# Patient Record
Sex: Male | Born: 1985 | Race: White | Hispanic: No | Marital: Married | Smoking: Former smoker
Health system: Southern US, Community
[De-identification: ages and names within clinical notes are randomized; demographics above are authoritative.]

---
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
## 2007-04-20 ENCOUNTER — Ambulatory Visit (HOSPITAL_COMMUNITY): Admission: RE | Admit: 2007-04-20 | Discharge: 2007-04-20 | Payer: Self-pay | Admitting: Family Medicine

## 2008-07-21 IMAGING — CT CT HEAD W/O CM
1 series · 16 of 30 positions shown, 20 images · non-contrast
Comparison: None

CLINICAL DATA: FRONTAL HEADACHE X4 DAYS;

CT HEAD WITHOUT CONTRAST
TECHNIQUE: Contiguous axial images were obtained from the base of
the skull through the vertex without contrast.

[Series 2: headseq 4.8 h37s · axial · 0.43mm/px · z∈[+1030,+1182]mm · 16 of 36 slices shown, 20 images]
[im 2/36  brain]
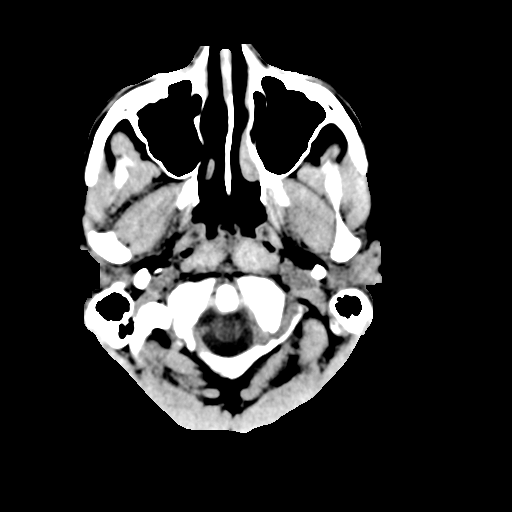
[im 2/36  bone]
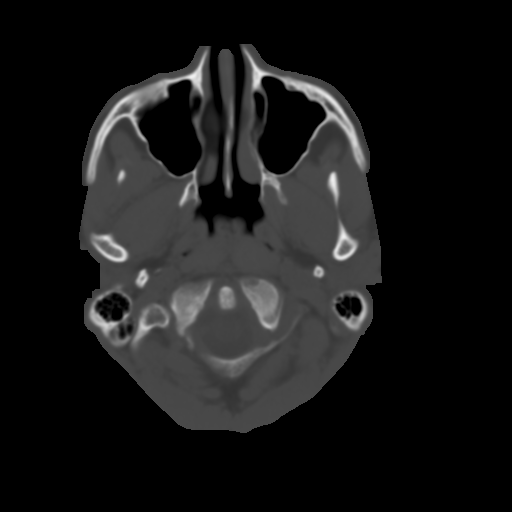
[im 4/36  brain]
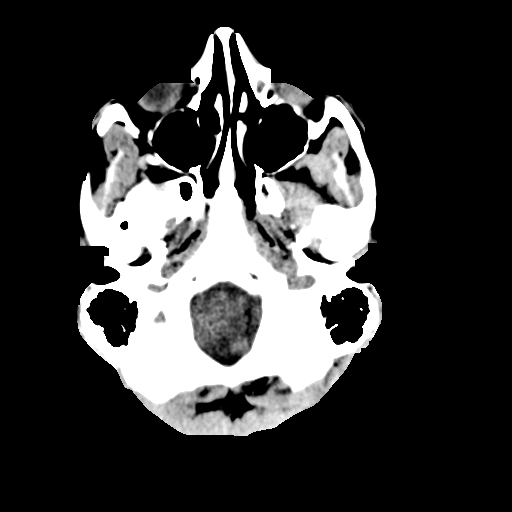
[im 7/36  brain]
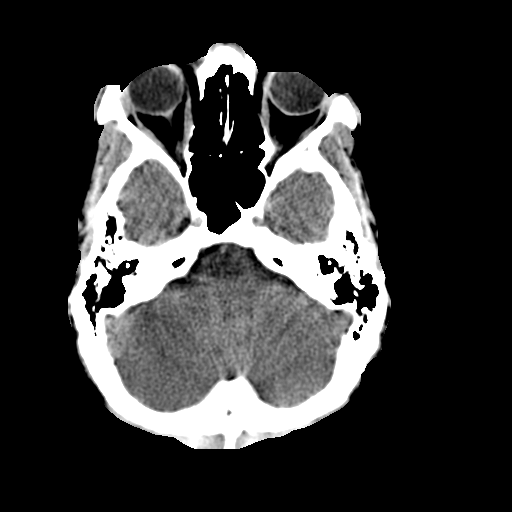
[im 9/36  brain]
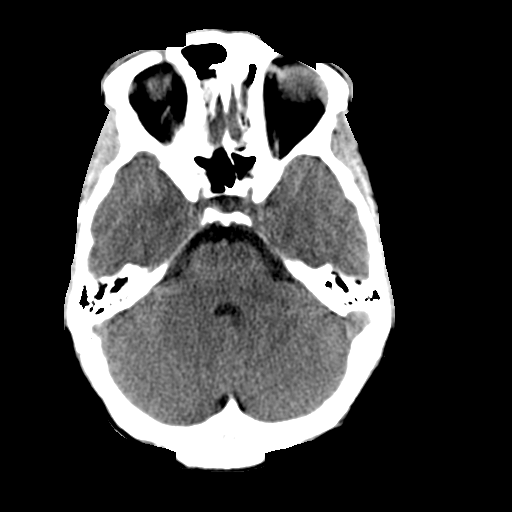
[im 10/36  brain]
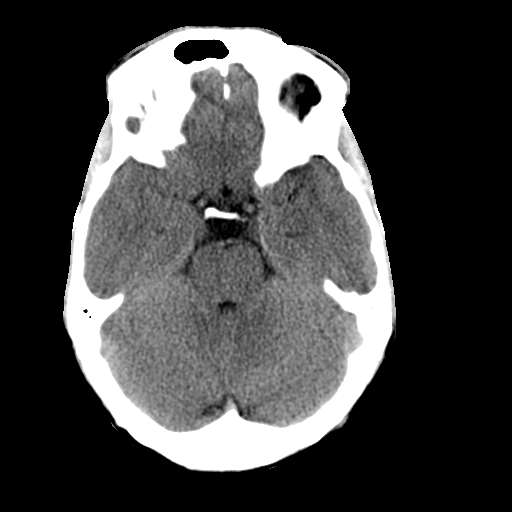
[im 10/36  bone]
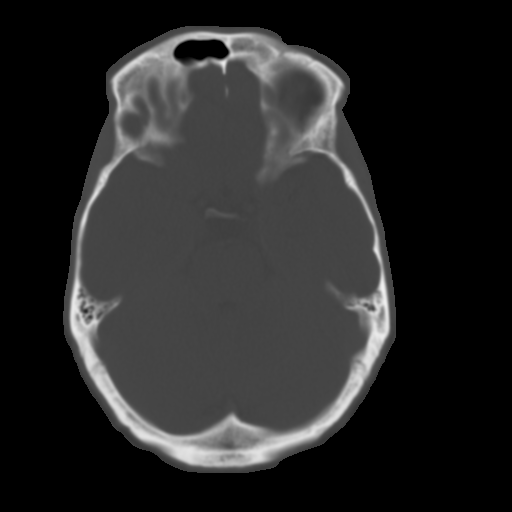
[im 13/36  brain]
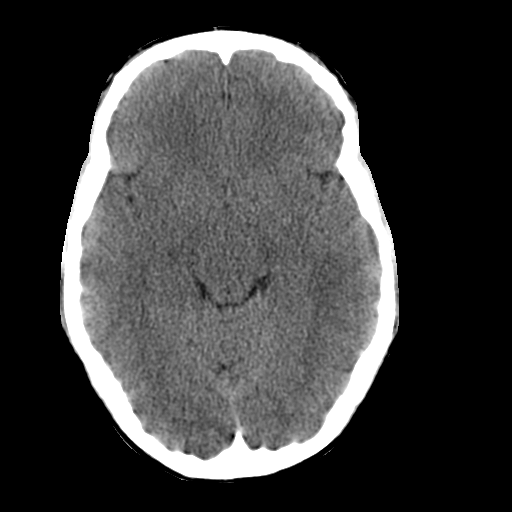
[im 15/36  brain]
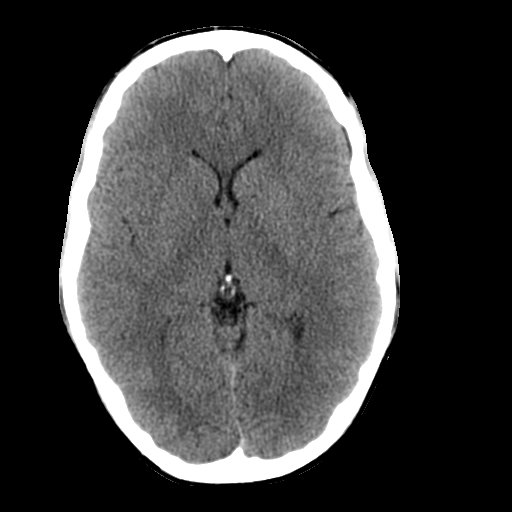
[im 17/36  brain]
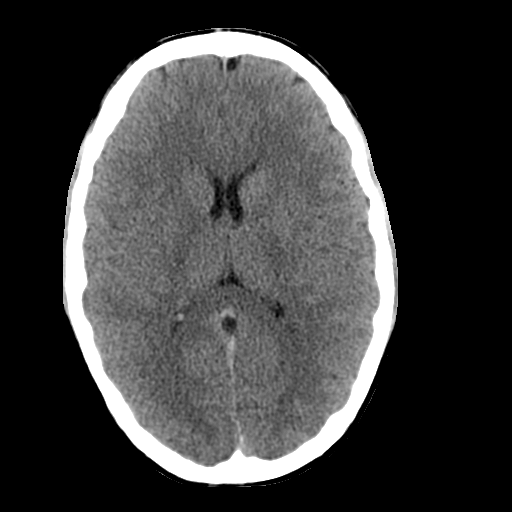
[im 19/36  brain]
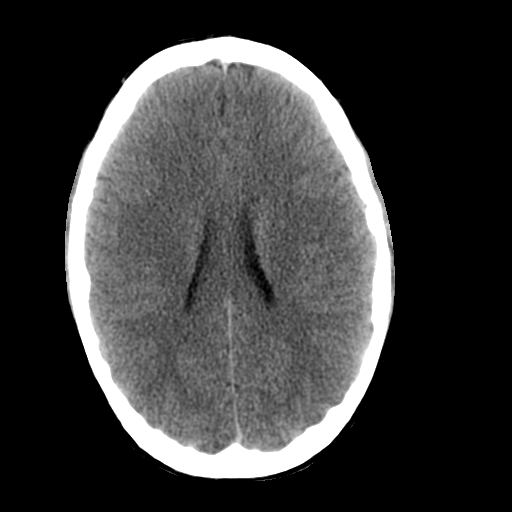
[im 19/36  bone]
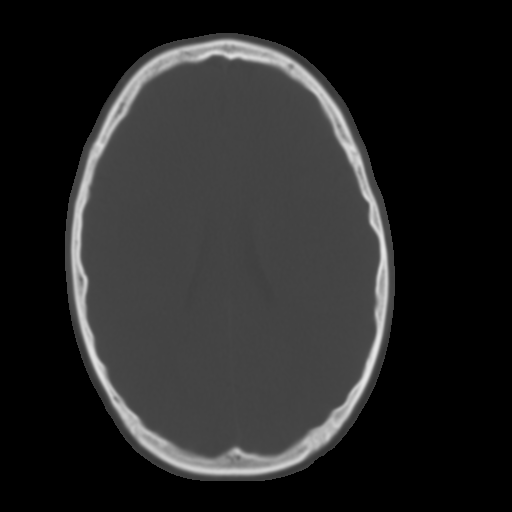
[im 21/36  brain]
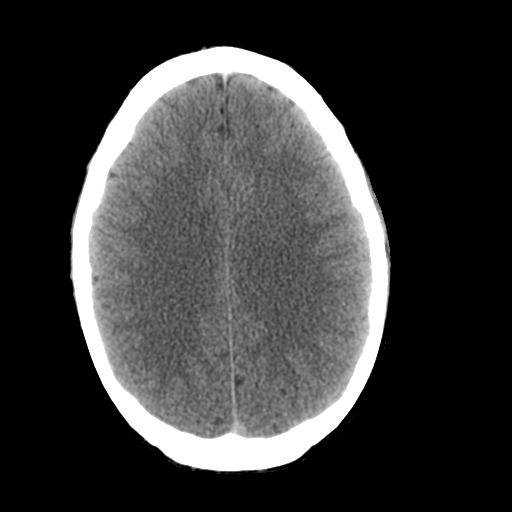
[im 23/36  brain]
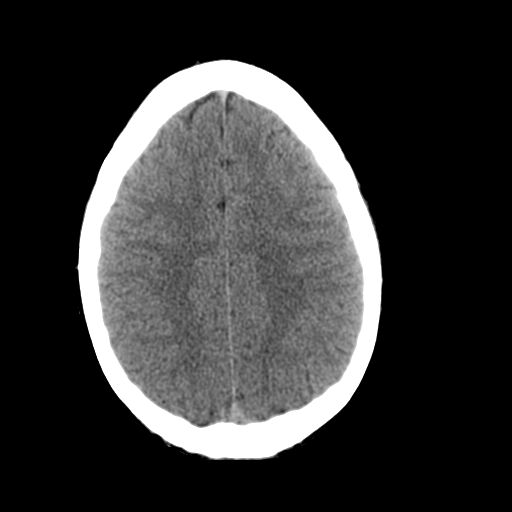
[im 26/36  brain]
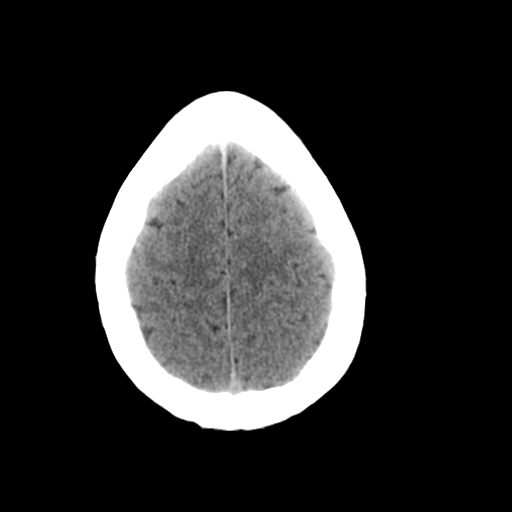
[im 27/36  brain]
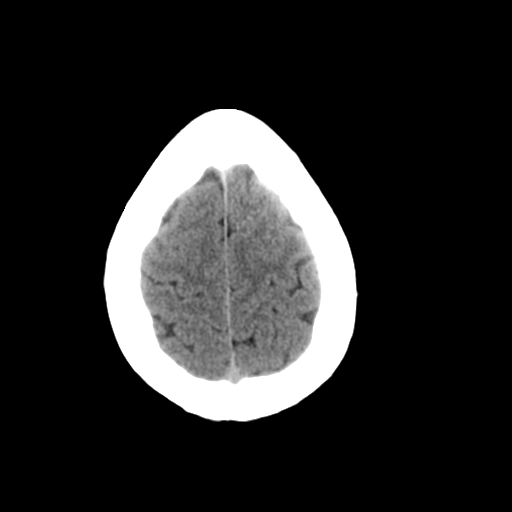
[im 27/36  bone]
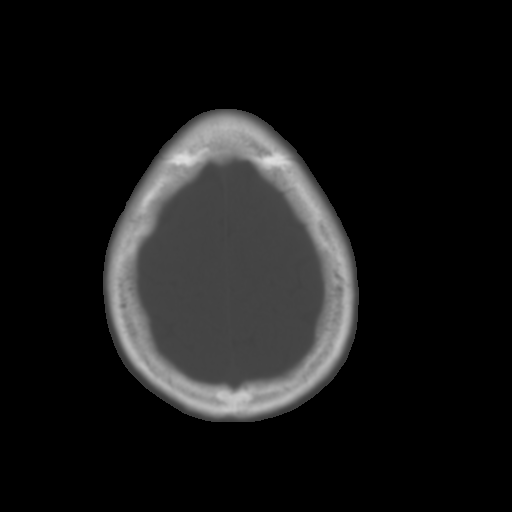
[im 29/36  brain]
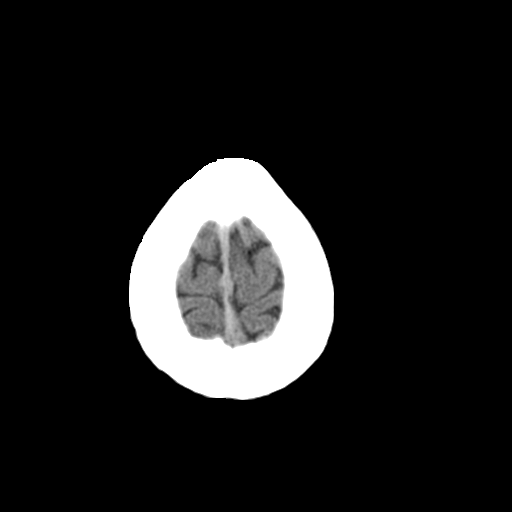
[im 32/36  brain]
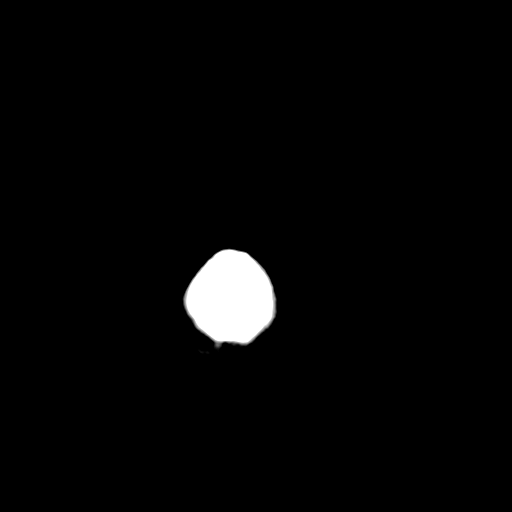
[im 34/36  brain]
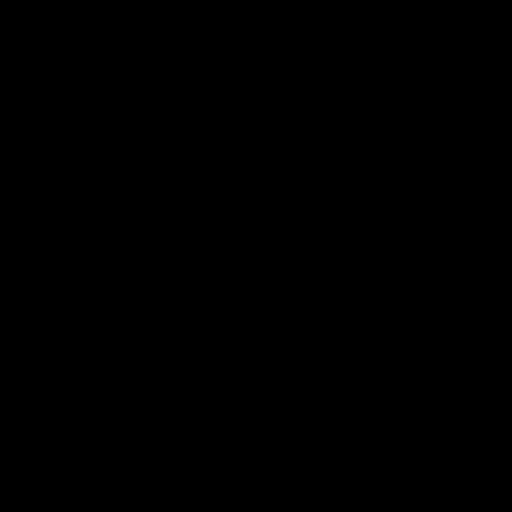

[16 of 30 positions shown; findings below may reference images not displayed]

FINDINGS: Minimal beam hardening from calvarium.
Normal ventricular morphology.
No midline shift or mass effect.
Normal appearance of brain parenchyma.
No intracranial hemorrhage, mass lesion, or acute infarct.
Visualized paranasal sinuses and mastoid air cells clear.
Bones unremarkable.
IMPRESSION: No acute intracranial abnormalities.

## 2010-05-01 ENCOUNTER — Emergency Department (HOSPITAL_COMMUNITY)
Admission: EM | Admit: 2010-05-01 | Discharge: 2010-05-01 | Disposition: A | Discharge status: Home / Self Care | Attending: Emergency Medicine | Admitting: Emergency Medicine

## 2010-05-01 DIAGNOSIS — R21 Rash and other nonspecific skin eruption: Secondary | ICD-10-CM | POA: Insufficient documentation

## 2012-11-29 ENCOUNTER — Emergency Department (HOSPITAL_COMMUNITY)
Admission: EM | Admit: 2012-11-29 | Discharge: 2012-11-29 | Disposition: A | Discharge status: Home / Self Care | Payer: BC Managed Care – PPO | Attending: Emergency Medicine | Admitting: Emergency Medicine

## 2012-11-29 ENCOUNTER — Encounter (HOSPITAL_COMMUNITY): Payer: Self-pay | Admitting: Emergency Medicine

## 2012-11-29 ENCOUNTER — Emergency Department (HOSPITAL_COMMUNITY): Payer: BC Managed Care – PPO

## 2012-11-29 DIAGNOSIS — Y939 Activity, unspecified: Secondary | ICD-10-CM | POA: Insufficient documentation

## 2012-11-29 DIAGNOSIS — Y929 Unspecified place or not applicable: Secondary | ICD-10-CM | POA: Insufficient documentation

## 2012-11-29 DIAGNOSIS — S62309A Unspecified fracture of unspecified metacarpal bone, initial encounter for closed fracture: Secondary | ICD-10-CM | POA: Insufficient documentation

## 2012-11-29 DIAGNOSIS — Z87891 Personal history of nicotine dependence: Secondary | ICD-10-CM | POA: Insufficient documentation

## 2012-11-29 DIAGNOSIS — W2209XA Striking against other stationary object, initial encounter: Secondary | ICD-10-CM | POA: Insufficient documentation

## 2012-11-29 DIAGNOSIS — S62339A Displaced fracture of neck of unspecified metacarpal bone, initial encounter for closed fracture: Secondary | ICD-10-CM

## 2012-11-29 MED ORDER — HYDROCODONE-ACETAMINOPHEN 5-325 MG PO TABS: 1.0000 | ORAL_TABLET | ORAL | Status: AC | PRN

## 2012-11-29 MED ORDER — HYDROCODONE-ACETAMINOPHEN 5-325 MG PO TABS
1.0000 | ORAL_TABLET | Freq: Once | ORAL | Status: AC
  Administered 2012-11-29: 1 via ORAL
  Filled 2012-11-29: qty 1

## 2012-11-29 NOTE — ED Notes (Signed)
Injured my right hand yesterday afternoon, lost my temper and hit something per pt. Possible fracture per pt.

## 2012-11-29 NOTE — ED Provider Notes (Signed)
CSN: [id number]     Arrival date & time 11/29/12  2019 History   First MD Initiated Contact with Patient 11/29/12 2035     Chief Complaint  Patient presents with  . Hand Injury   (Consider location/radiation/quality/duration/timing/severity/associated sxs/prior Treatment) HPI Comments: TANAV ORSAK is a 27 y.o. Male presenting with right hand pain localized to his 5th finger since yesterday afternoon when he lost his temper and struck a wall.  He has pain and swelling which is worsened with palpation and range of motion.  He has applied an ice pack and has taken ibuprofen without improvement in pain.  He denies numbness in the finger or hand.  He has problems trying to keep the 5th finger aligned with the others.       The history is provided by the patient.    History reviewed. No pertinent past medical history. History reviewed. No pertinent past surgical history. History reviewed. No pertinent family history. History  Substance Use Topics  . Smoking status: Former Games developer  . Smokeless tobacco: Current User  . Alcohol Use: Yes     Comment: social    Review of Systems  Constitutional: Negative for fever and chills.  Musculoskeletal: Positive for arthralgias and joint swelling. Negative for myalgias.  Neurological: Negative for weakness and numbness.    Allergies  Review of patient's allergies indicates no known allergies.  Home Medications   Current Outpatient Rx  Name  Route  Sig  Dispense  Refill  . ibuprofen (ADVIL,MOTRIN) 200 MG tablet   Oral   Take 600 mg by mouth once as needed for pain.         Marland Kitchen HYDROcodone-acetaminophen (NORCO/VICODIN) 5-325 MG per tablet   Oral   Take 1 tablet by mouth every 4 (four) hours as needed for pain.   15 tablet   0    BP 161/112  Pulse 81  Temp(Src) 98.2 F (36.8 C) (Oral)  Resp 20  Ht 5\' 6"  (1.676 m)  Wt 200 lb (90.719 kg)  BMI 32.30 kg/m2  SpO2 100% Physical Exam  Constitutional: He appears well-developed and  well-nourished.  HENT:  Head: Atraumatic.  Neck: Normal range of motion.  Cardiovascular:  Pulses equal bilaterally  Musculoskeletal: He exhibits edema and tenderness.       Right hand: He exhibits bony tenderness and swelling. He exhibits normal two-point discrimination, normal capillary refill and no deformity. Normal sensation noted.       Hands: Neurological: He is alert. He has normal strength. He displays normal reflexes. No sensory deficit.  Equal strength  Skin: Skin is warm and dry.  Psychiatric: He has a normal mood and affect.    ED Course  Procedures (including critical care time) Labs Review Labs Reviewed - No data to display Imaging Review Dg Hand Complete Right  11/29/2012   CLINICAL DATA:  Hand injury.  EXAM: RIGHT HAND - COMPLETE 3+ VIEW  COMPARISON:  Radiographs 03/07/2006  FINDINGS: There is a minimally displaced boxer's fracture without involvement of the articular surface of the 5th metacarpal head. No other acute fractures are identified. There is no dislocation. Mild soft tissue swelling is noted in the ulnar aspect of the hand.  IMPRESSION: Minimally displaced boxer's fracture.   Electronically Signed   By: Roxy Horseman M.D.   On: 11/29/2012 20:50    EKG Interpretation   None       MDM   1. Boxer's fracture, closed, initial encounter    Patients labs and/or radiological  studies were viewed and considered during the medical decision making and disposition process. Pt was placed in ulner gutter splint.  He was given sling.  Prescribed hydrocodone.  Pt to f/u with ortho - will call in am for appt.   Splint was examined post application, pain improved,  Patient can wiggle digits, less than 3 sec cap refill.      Burgess Amor, PA-C 11/29/12 2229

## 2012-11-30 ENCOUNTER — Encounter: Payer: Self-pay | Admitting: Orthopedic Surgery

## 2012-11-30 ENCOUNTER — Ambulatory Visit (INDEPENDENT_AMBULATORY_CARE_PROVIDER_SITE_OTHER): Payer: BC Managed Care – PPO | Admitting: Orthopedic Surgery

## 2012-11-30 VITALS — BP 140/96 | Ht 66.0 in | Wt 195.0 lb

## 2012-11-30 DIAGNOSIS — S62339A Displaced fracture of neck of unspecified metacarpal bone, initial encounter for closed fracture: Secondary | ICD-10-CM | POA: Insufficient documentation

## 2012-11-30 DIAGNOSIS — S62309A Unspecified fracture of unspecified metacarpal bone, initial encounter for closed fracture: Secondary | ICD-10-CM

## 2012-11-30 NOTE — Patient Instructions (Addendum)
Keep fingers taped x 4 weeks   Take medication as needed   OOW X 4 WEEKS

## 2012-11-30 NOTE — Progress Notes (Signed)
Patient ID: Erik Hammond, male   DOB: Nov 24, 1985, 27 y.o.   MRN: [id number]  Chief Complaint  Patient presents with  . Hand Pain    Right boxers fracture d/t injury 11/28/12    HISTORY:  27 year old malee presents with a fractured right fifth metacarpal secondary to hitting  Date of injury 11/28/2012  Initial ER visit November 2. 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 Ambulating normally  The right hand is swollen there are 2 abrasions over the knuckles of the small and ring finger he has decreased range of motion tenderness at the fracture site no malalignment issues joint is stable muscle tone is normal no atrophy in the hand skin is otherwise normal pulse is good capillary refill is good sensation is normal  X-ray shows a nondisplaced hairline fracture of the distal aspect of the fifth metacarpal  Encounter Diagnosis  Name Primary?  Marland Kitchen Boxer's fracture, closed, initial encounter Yes    Recommend buddy taping active range of motion to work 2 weeks x-ray in 4 weeks  Office visit only

## 2012-11-30 NOTE — ED Provider Notes (Signed)
Medical screening examination/treatment/procedure(s) were performed by non-physician practitioner and as supervising physician I was immediately available for consultation/collaboration.  EKG Interpretation   None         Laray Anger, DO 11/30/12 1610

## 2012-12-14 ENCOUNTER — Telehealth: Payer: Self-pay | Admitting: Orthopedic Surgery

## 2012-12-14 ENCOUNTER — Encounter: Payer: Self-pay | Admitting: Orthopedic Surgery

## 2012-12-14 NOTE — Telephone Encounter (Signed)
Note is ready.  Patient aware - coming  today, 12/14/12 to pick up.

## 2012-12-14 NOTE — Telephone Encounter (Signed)
Give him the note

## 2012-12-14 NOTE — Telephone Encounter (Signed)
Patient called to request an updated work note (for 2 weeks from today, which is the date of his next appointment) as originally discussed at initial visit.  Wishes to pick up note today - all ready to view and sign.  Patient's ph# [phone]

## 2012-12-29 ENCOUNTER — Ambulatory Visit (INDEPENDENT_AMBULATORY_CARE_PROVIDER_SITE_OTHER): Payer: BC Managed Care – PPO | Admitting: Orthopedic Surgery

## 2012-12-29 ENCOUNTER — Encounter: Payer: Self-pay | Admitting: Orthopedic Surgery

## 2012-12-29 ENCOUNTER — Ambulatory Visit (INDEPENDENT_AMBULATORY_CARE_PROVIDER_SITE_OTHER): Payer: BC Managed Care – PPO

## 2012-12-29 VITALS — BP 136/100 | Ht 66.0 in | Wt 195.0 lb

## 2012-12-29 DIAGNOSIS — S62339A Displaced fracture of neck of unspecified metacarpal bone, initial encounter for closed fracture: Secondary | ICD-10-CM

## 2012-12-29 DIAGNOSIS — S62309A Unspecified fracture of unspecified metacarpal bone, initial encounter for closed fracture: Secondary | ICD-10-CM

## 2012-12-29 NOTE — Progress Notes (Signed)
Patient ID: Erik Hammond, male   DOB: [birth year], 27 y.o.   MRN: [id number]  Chief Complaint  Patient presents with  . Follow-up    4 week follow up right boxers fracture DOI 11/28/12    BP 136/100  Ht 5\' 6"  (1.676 m)  Wt 195 lb (88.451 kg)  BMI 31.49 kg/m2  Encounter Diagnosis  Name Primary?  . Closed boxer's fracture Yes   Right hand injury:  Ros: normal   General appearance is normal, the patient is alert and oriented x3 with normal mood and affect. Full range of motion  No deforemity  No swelling and no tenderness    Repeat x-rays right fifth metacarpal boxer's fracture  X-rays show fracture healing  Patient will be released to normal activities

## 2012-12-29 NOTE — Patient Instructions (Addendum)
activities as tolerated  Fit for full duty

## 2014-03-02 IMAGING — CR DG HAND COMPLETE 3+V*R*
3 series · 3 of 3 positions shown · non-contrast
Comparison: Radiographs 03/07/2006

CLINICAL DATA: Hand injury.

EXAM:
RIGHT HAND - COMPLETE 3+ VIEW

[view not recorded (1 of 3)]
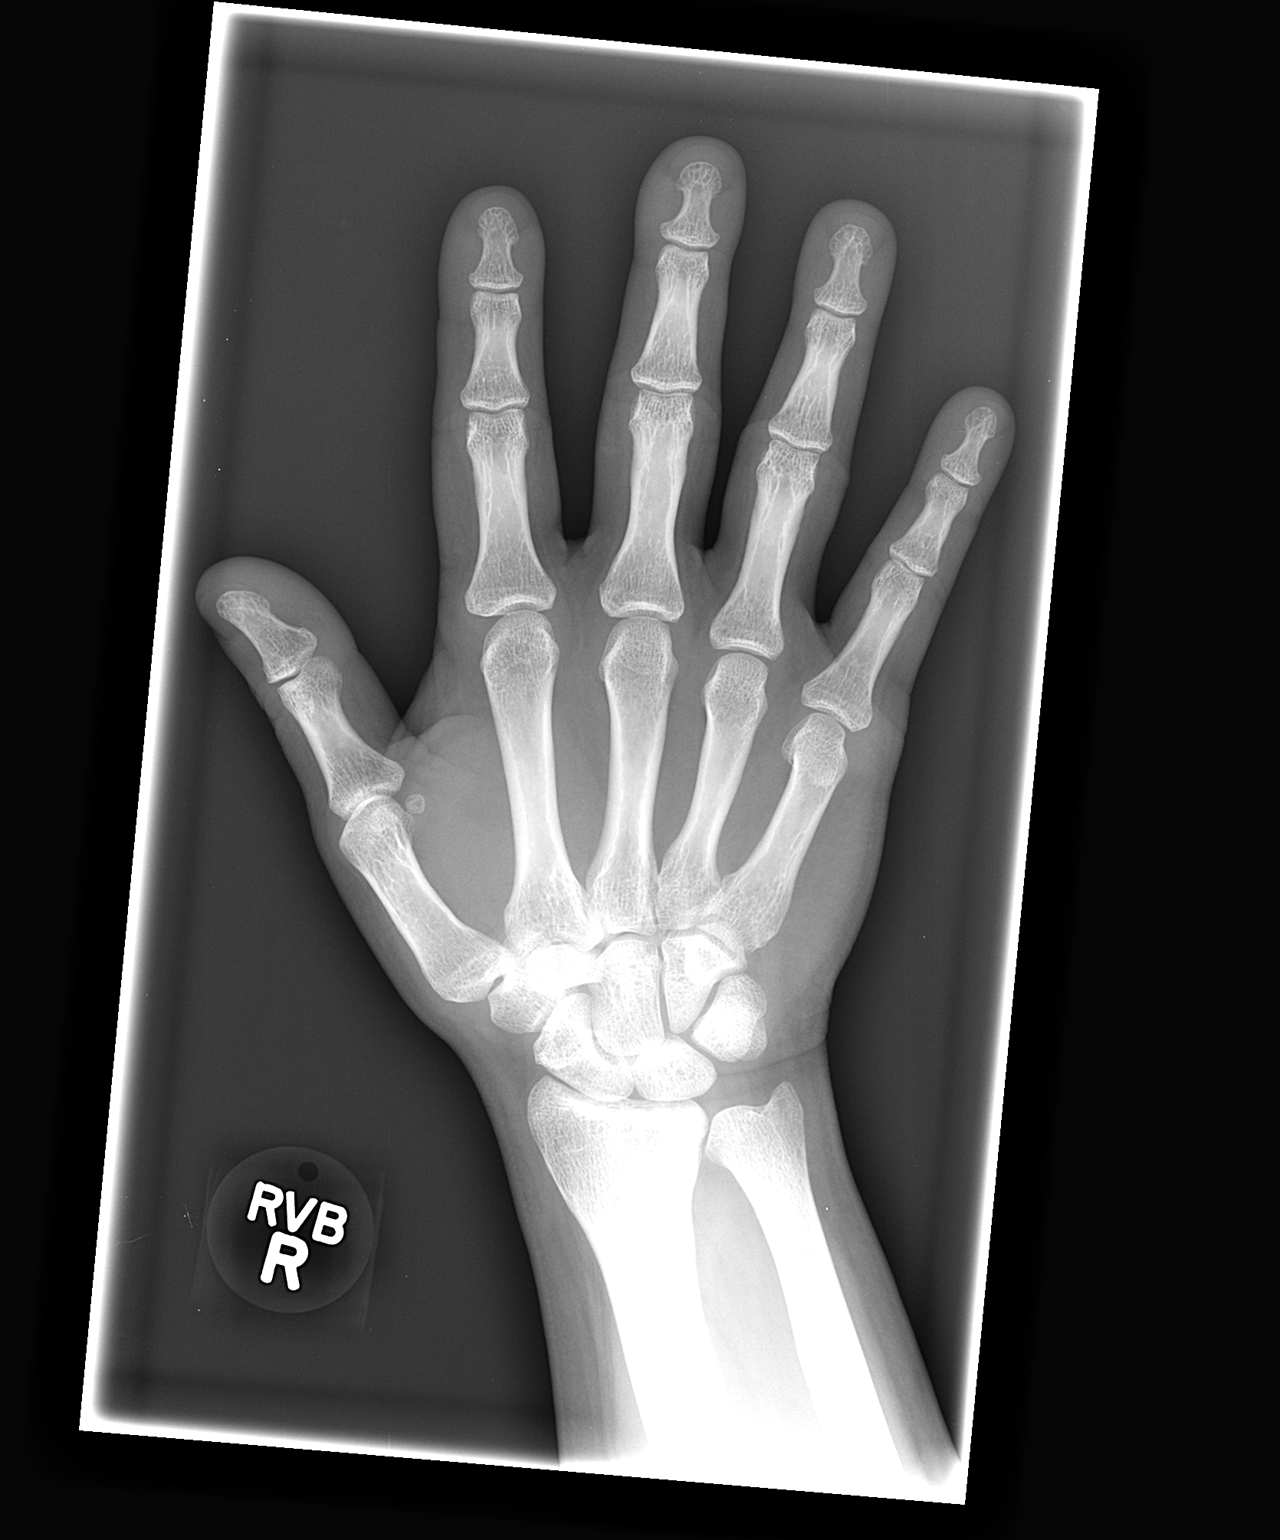

[view not recorded (2 of 3)]
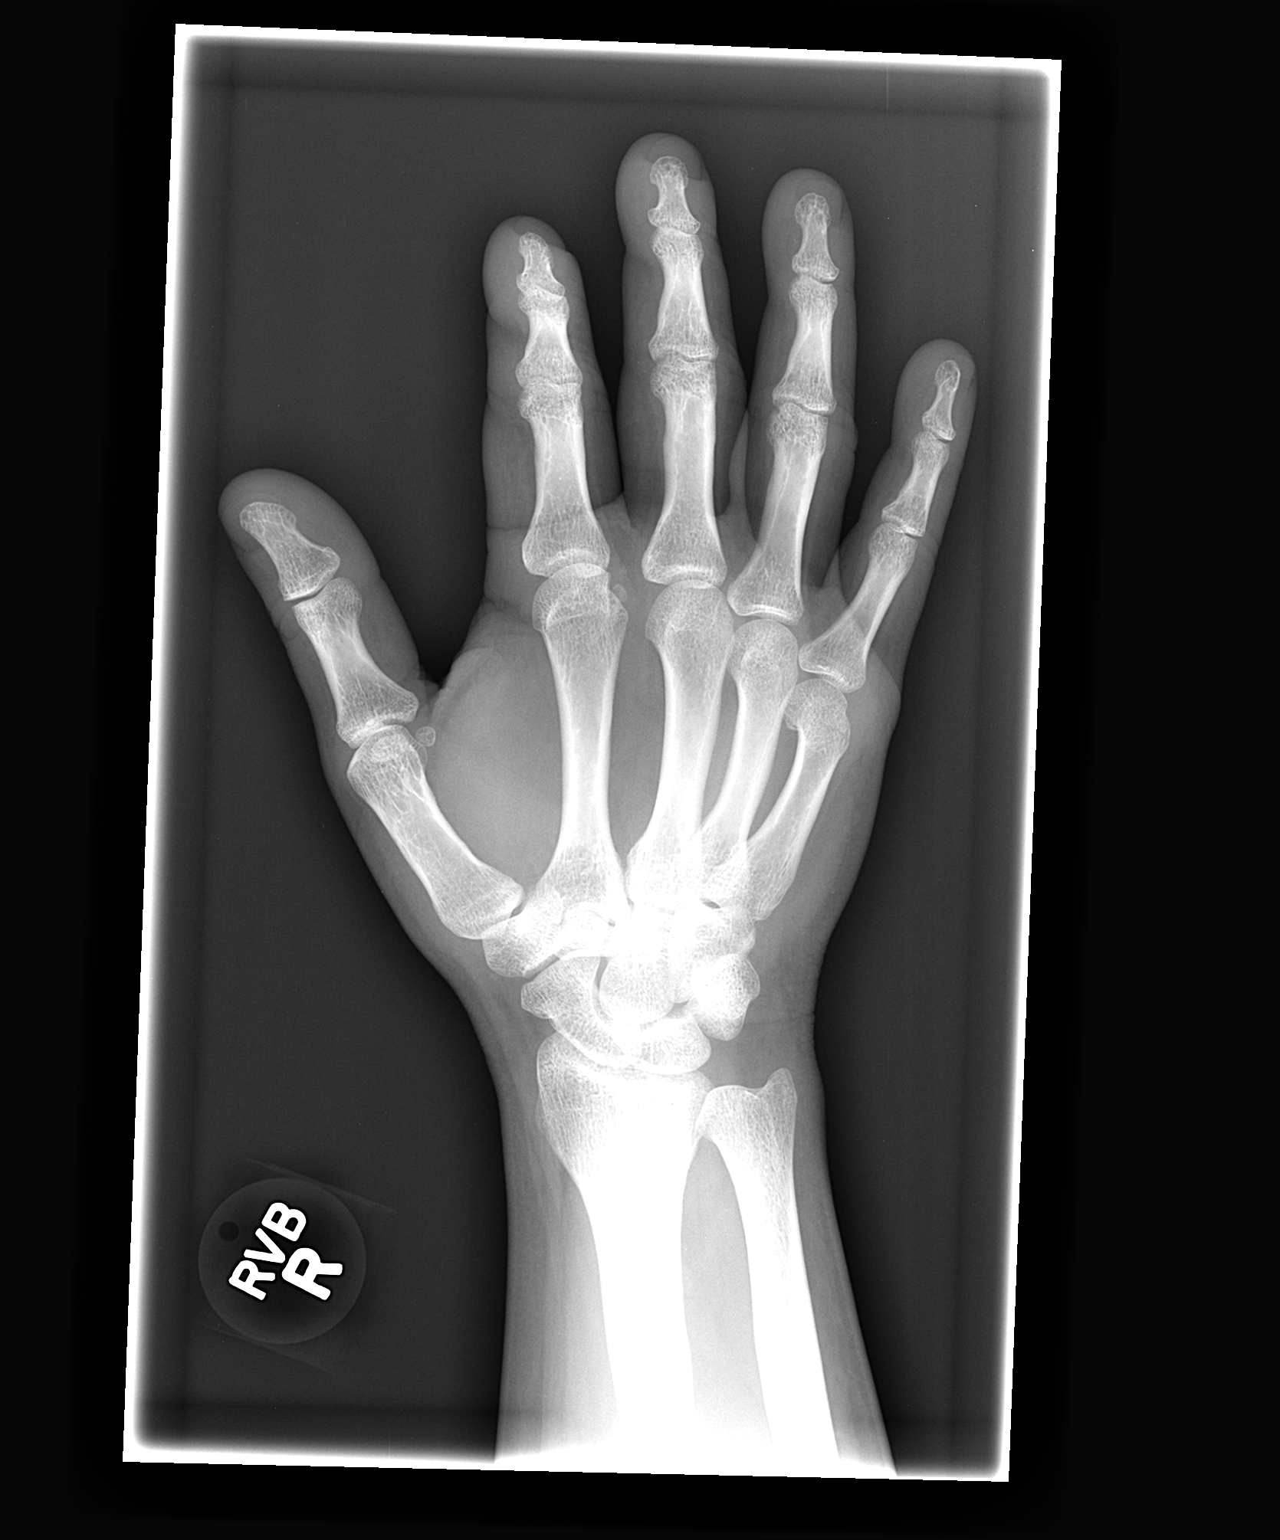

[view not recorded (3 of 3)]
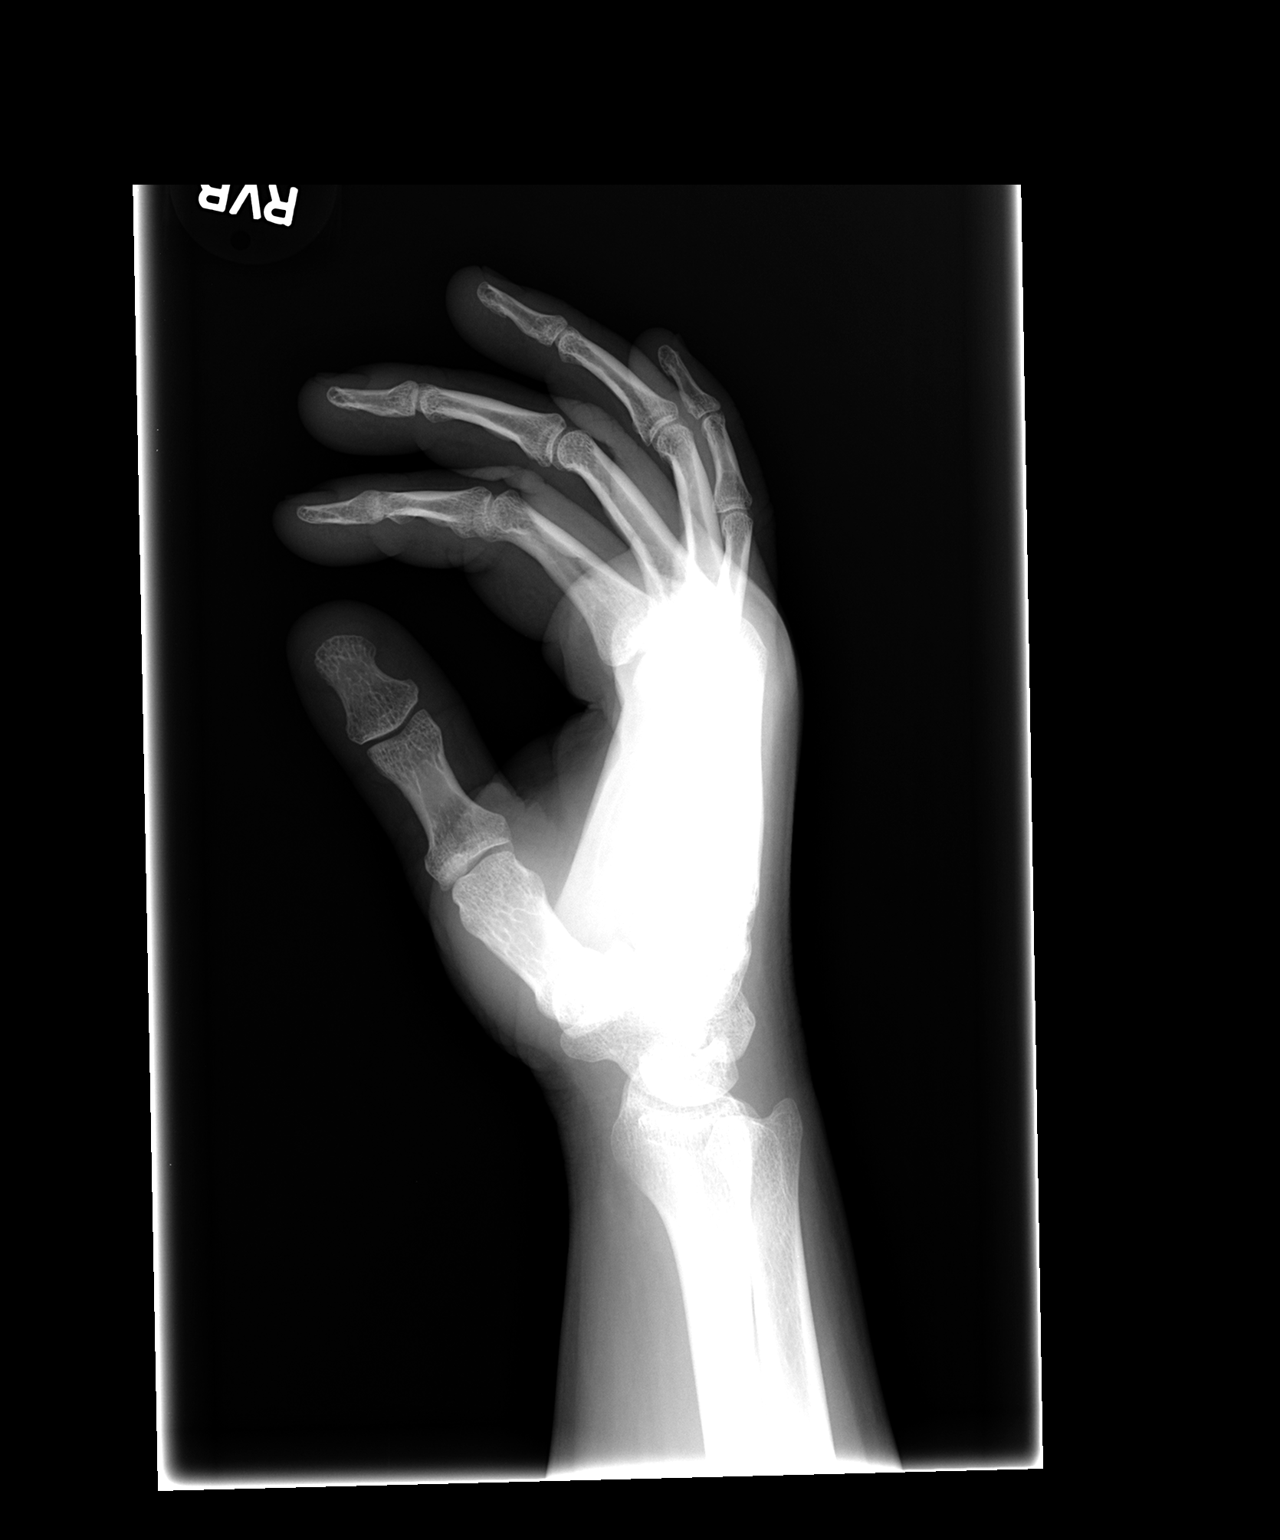

[3 of 3 positions shown; findings below may reference images not displayed]

FINDINGS: There is a minimally displaced boxer's fracture without involvement
of the articular surface of the 5th metacarpal head. No other acute
fractures are identified. There is no dislocation. Mild soft tissue
swelling is noted in the ulnar aspect of the hand.
IMPRESSION: Minimally displaced boxer's fracture.

## 2014-08-13 ENCOUNTER — Encounter (HOSPITAL_COMMUNITY): Payer: Self-pay | Admitting: Emergency Medicine

## 2014-08-13 ENCOUNTER — Emergency Department (HOSPITAL_COMMUNITY)
Admission: EM | Admit: 2014-08-13 | Discharge: 2014-08-13 | Disposition: A | Discharge status: Home / Self Care | Payer: BLUE CROSS/BLUE SHIELD | Attending: Emergency Medicine | Admitting: Emergency Medicine

## 2014-08-13 DIAGNOSIS — Z87891 Personal history of nicotine dependence: Secondary | ICD-10-CM | POA: Diagnosis not present

## 2014-08-13 DIAGNOSIS — S61412A Laceration without foreign body of left hand, initial encounter: Secondary | ICD-10-CM | POA: Diagnosis not present

## 2014-08-13 DIAGNOSIS — Y998 Other external cause status: Secondary | ICD-10-CM | POA: Diagnosis not present

## 2014-08-13 DIAGNOSIS — Y9289 Other specified places as the place of occurrence of the external cause: Secondary | ICD-10-CM | POA: Diagnosis not present

## 2014-08-13 DIAGNOSIS — Y288XXA Contact with other sharp object, undetermined intent, initial encounter: Secondary | ICD-10-CM | POA: Insufficient documentation

## 2014-08-13 DIAGNOSIS — Y9389 Activity, other specified: Secondary | ICD-10-CM | POA: Insufficient documentation

## 2014-08-13 MED ORDER — LIDOCAINE HCL (PF) 1 % IJ SOLN
INTRAMUSCULAR | Status: AC
  Filled 2014-08-13: qty 5

## 2014-08-13 NOTE — Discharge Instructions (Signed)
Sutured Wound Care °Sutures are stitches that can be used to close wounds. Wound care helps prevent pain and infection.  °HOME CARE INSTRUCTIONS  °· Rest and elevate the injured area until all the pain and swelling are gone. °· Only take over-the-counter or prescription medicines for pain, discomfort, or fever as directed by your caregiver. °· After 48 hours, gently wash the area with mild soap and water once a day, or as directed. Rinse off the soap. Pat the area dry with a clean towel. Do not rub the wound. This may cause bleeding. °· Follow your caregiver's instructions for how often to change the bandage (dressing). Stop using a dressing after 2 days or after the wound stops draining. °· If the dressing sticks, moisten it with soapy water and gently remove it. °· Apply ointment on the wound as directed. °· Avoid stretching a sutured wound. °· Drink enough fluids to keep your urine clear or pale yellow. °· Follow up with your caregiver for suture removal as directed. °· Use sunscreen on your wound for the next 3 to 6 months so the scar will not darken. °SEEK IMMEDIATE MEDICAL CARE IF:  °· Your wound becomes red, swollen, hot, or tender. °· You have increasing pain in the wound. °· You have a red streak that extends from the wound. °· There is pus coming from the wound. °· You have a fever. °· You have shaking chills. °· There is a bad smell coming from the wound. °· You have persistent bleeding from the wound. °MAKE SURE YOU:  °· Understand these instructions. °· Will watch your condition. °· Will get help right away if you are not doing well or get worse. °Document Released: 02/22/2004 Document Revised: 04/08/2011 Document Reviewed: 05/20/2010 °ExitCare® Patient Information ©2015 [employer]. This information is not intended to replace advice given to you by your health care provider. Make sure you discuss any questions you have with your health care provider. ° °

## 2014-08-13 NOTE — ED Provider Notes (Signed)
CSN: [id number]     Arrival date & time 08/13/14  1726 History   First MD Initiated Contact with Patient 08/13/14 1735     Chief Complaint  Patient presents with  . Laceration     (Consider location/radiation/quality/duration/timing/severity/associated sxs/prior Treatment) Patient is a 29 y.o. male presenting with skin laceration. The history is provided by the patient.  Laceration Location:  Hand Length (cm):  2.5 Depth:  Through underlying tissue Quality: jagged   Bleeding: controlled with pressure   Time since incident:  3 minutes Laceration mechanism:  Knife Pain details:    Quality:  Aching   Severity:  Mild Foreign body present:  No foreign bodies Relieved by:  Pressure Worsened by:  Nothing tried Tetanus status:  Up to date  Erik Hammond is a.28 y.o. male who presents to the ED with a laceration to the left hand between the middle and index finger. He was reaching up to a shelf to get something and a machete fell and cut his hand. He denies any other injuries.   History reviewed. No pertinent past medical history. History reviewed. No pertinent past surgical history. No family history on file. History  Substance Use Topics  . Smoking status: Former Games developermoker  . Smokeless tobacco: Current User  . Alcohol Use: Yes     Comment: social    Review of Systems  Negative except as stated in HPI  Allergies  Review of patient's allergies indicates no known allergies.  Home Medications   Prior to Admission medications   Medication Sig Start Date End Date Taking? Authorizing Provider  HYDROcodone-acetaminophen (NORCO/VICODIN) 5-325 MG per tablet Take 1 tablet by mouth every 4 (four) hours as needed for pain. 11/29/12   Burgess AmorJulie Idol, PA-C  ibuprofen (ADVIL,MOTRIN) 200 MG tablet Take 600 mg by mouth once as needed for pain.    Historical Provider, MD   BP 148/108 mmHg  Pulse 97  Temp(Src) 98.3 F (36.8 C) (Oral)  Resp 16  Ht 5\' 6"  (1.676 m)  Wt 190 lb (86.183 kg)   BMI 30.68 kg/m2  SpO2 100% Physical Exam  Constitutional: He is oriented to person, place, and time. He appears well-developed and well-nourished. No distress.  HENT:  Head: Normocephalic.  Eyes: EOM are normal.  Neck: Neck supple.  Cardiovascular: Normal rate.   Pulmonary/Chest: Effort normal.  Musculoskeletal: Normal range of motion.       Left hand: He exhibits tenderness and laceration. He exhibits normal range of motion and normal capillary refill. Normal sensation noted. Normal strength noted.       Hands: Neurological: He is alert and oriented to person, place, and time. No cranial nerve deficit.  Skin: Skin is warm and dry.  Psychiatric: He has a normal mood and affect. His behavior is normal.  Nursing note and vitals reviewed.   ED Course  LACERATION REPAIR Date/Time: 08/13/2014 6:25 PM Performed by: Janne NapoleonNEESE, Jeanine Caven M Authorized by: Janne NapoleonNEESE, Jaccob Czaplicki M Consent: Verbal consent obtained. Risks and benefits: risks, benefits and alternatives were discussed Consent given by: patient Patient understanding: patient states understanding of the procedure being performed Required items: required blood products, implants, devices, and special equipment available Patient identity confirmed: verbally with patient Body area: upper extremity Location details: left hand Laceration length: 2.5 cm Foreign bodies: metal Tendon involvement: none Nerve involvement: none Vascular damage: no Anesthesia: local infiltration Local anesthetic: lidocaine 1% without epinephrine Anesthetic total: 4 ml Patient sedated: no Preparation: Patient was prepped and draped in the usual sterile fashion.  Irrigation solution: saline Irrigation method: syringe Amount of cleaning: standard Debridement: none Degree of undermining: none Skin closure: 5-0 Prolene Number of sutures: 4 Technique: simple Approximation: close Approximation difficulty: simple Patient tolerance: Patient tolerated the procedure well  with no immediate complications    MDM  29 y.o. male with laceration of the left hand. Stable for d/c without focal neuro deficits. He will return in one week for suture removal or sooner for any problems. Discussed with the patient and all questioned fully answered.   Final diagnoses:  Laceration of hand, left, initial encounter       [facility], NP 08/13/14 1828  Donnetta Hutching, MD 08/13/14 [phone]

## 2014-08-13 NOTE — ED Notes (Signed)
Pt has laceration between left index and middle finger from a fallen machete.

## 2018-04-23 ENCOUNTER — Encounter (INDEPENDENT_AMBULATORY_CARE_PROVIDER_SITE_OTHER): Payer: Self-pay | Admitting: Internal Medicine

## 2019-02-09 ENCOUNTER — Other Ambulatory Visit: Payer: Self-pay

## 2019-02-09 ENCOUNTER — Ambulatory Visit: Payer: PRIVATE HEALTH INSURANCE | Attending: Internal Medicine

## 2019-02-09 DIAGNOSIS — Z20822 Contact with and (suspected) exposure to covid-19: Secondary | ICD-10-CM

## 2019-02-11 LAB — NOVEL CORONAVIRUS, NAA: SARS-CoV-2, NAA: NOT DETECTED

## 2022-01-29 ENCOUNTER — Encounter: Payer: Self-pay | Admitting: Urology

## 2022-01-29 ENCOUNTER — Ambulatory Visit: Payer: BC Managed Care – PPO | Admitting: Urology

## 2022-01-29 VITALS — BP 154/95 | HR 90

## 2022-01-29 DIAGNOSIS — Z3009 Encounter for other general counseling and advice on contraception: Secondary | ICD-10-CM

## 2022-01-29 MED ORDER — DIAZEPAM 10 MG PO TABS: 10.0000 mg | ORAL_TABLET | Freq: Once | ORAL | 0 refills | Status: AC

## 2022-01-29 NOTE — Patient Instructions (Signed)
Vasectomy Vasectomy is a procedure in which the vas deferens is cut and then tied or burned (cauterized). The vas deferens is a tube that carries sperm from the testicle to the part of the body that drains urine from the bladder (urethra). This procedure blocks sperm from going through the vas deferens and penis during ejaculation. This ensures that sperm does not go into the vagina during sex. Vasectomy does not affect sexual desire or performance and does not prevent sexually transmitted infections. Vasectomy is considered a permanent and very effective form of birth control (contraception). The decision to have a vasectomy should not be made during a stressful time, such as after the loss of a pregnancy or a divorce. You and your partner should decide on whether to have a vasectomy when you are sure that you do not want children in the future. Tell a health care provider about: Any allergies you have. All medicines you are taking, including vitamins, herbs, eye drops, creams, and over-the-counter medicines. Any problems you or family members have had with anesthetic medicines. Any blood disorders you have. Any surgeries you have had. Any medical conditions you have. What are the risks? Generally, this is a safe procedure. However, problems may occur, including: Infection. Bleeding and swelling of the scrotum. The scrotum is the sac that contains the testicles, blood vessels, and structures that help deliver sperm and semen. Allergic reactions to medicines. Failure of the procedure to prevent pregnancy. There is a very small chance that the tied or cauterized ends of the vas deferens may reconnect (recanalization). If this happens, you could still make a woman pregnant. Pain in the scrotum that continues after you heal from the procedure. What happens before the procedure? Medicines Ask your health care provider about: Changing or stopping your regular medicines. This is especially important if  you are taking diabetes medicines or blood thinners. Taking medicines such as aspirin and ibuprofen. These medicines can thin your blood. Do not take these medicines unless your health care provider tells you to take them. Taking over-the-counter medicines, vitamins, herbs, and supplements. You may be told to take a medicine to help you relax (sedative) a few hours before the procedure. General instructions Do not use any products that contain nicotine or tobacco for at least 4 weeks before the procedure. These products include cigarettes, e-cigarettes, and chewing tobacco. If you need help quitting, ask your health care provider. Plan to have a responsible adult take you home from the hospital or clinic. If you will be going home right after the procedure, plan to have a responsible adult care for you for the time you are told. This is important. Ask your health care provider: How your surgery site will be marked. What steps will be taken to help prevent infection. These steps may include: Removing hair at the surgery site. Washing skin with a germ-killing soap. Taking antibiotic medicine. What happens during the procedure?  You will be given one or more of the following: A sedative, unless you were told to take this a few hours before the procedure. A medicine to numb the area (local anesthetic). Your health care provider will feel, or palpate, for your vas deferens. To reach the vas deferens, one of two methods may be used: A very small incision may be made in your scrotum. A punctured opening may be made in your scrotum, without an incision. Your vas deferens will be pulled out of your scrotum and cut. Then, the vas deferens will be closed   in one of two ways: Tied at the ends. Cauterized at the ends to seal them off. The vas deferens will be put back into your scrotum. The incision or puncture opening will be closed with absorbable stitches (sutures). The sutures will eventually  dissolve and will not need to be removed after the procedure. The procedure will be repeated on the other side of your scrotum. The procedure may vary among health care providers and hospitals. What happens after the procedure? You will be monitored to make sure that you do not have problems. You will be asked not to ejaculate for at least 1 week after the procedure, or for as long as you are told. You will need to use a different form of contraception for 2-4 months after the procedure, until you have test results confirming that there are no sperm in your semen. You may be given scrotal support to wear, such as a jockstrap or underwear with a supportive pouch. If you were given a sedative during the procedure, it can affect you for several hours. Do not drive or operate machinery until your health care provider says that it is safe. Summary Vasectomy blocks sperm from being released during ejaculation. This procedure is considered a permanent and very effective form of birth control. Your scrotum will be numbed with medicine (local anesthetic) for the procedure. After the procedure, you will be asked not to ejaculate for at least 1 week, or for as long as you are told. You will also need to use a different form of contraception until your test results confirm that there are no sperm in your semen. This information is not intended to replace advice given to you by your health care provider. Make sure you discuss any questions you have with your health care provider. Document Revised: 06/03/2019 Document Reviewed: 06/03/2019 Elsevier Patient Education  2023 [employer]  

## 2022-01-29 NOTE — Progress Notes (Signed)
   01/29/2022 4:41 PM   Erik Hammond [birth year] [id number]  Referring provider: [facility] [address] [place],  [place] [address]  Desires sterilization   HPI: Mr Cadden is a 37yo her for consideration of vasectomy. He has 3 healthy children. No scrotal surgeries.    PMH: No past medical history on file.  Surgical History: No past surgical history on file.  Home Medications:  Allergies as of 01/29/2022   No Known Allergies      Medication List        Accurate as of January 29, 2022  4:41 PM. If you have any questions, ask your nurse or doctor.          buPROPion 300 MG 24 hr tablet Commonly known as: WELLBUTRIN XL Take 300 mg by mouth daily.   HYDROcodone-acetaminophen 5-325 MG tablet Commonly known as: NORCO/VICODIN Take 1 tablet by mouth every 4 (four) hours as needed for pain.   ibuprofen 200 MG tablet Commonly known as: ADVIL Take 600 mg by mouth once as needed for pain.        Allergies: No Known Allergies  Family History: No family history on file.  Social History:  reports that he has quit smoking. He uses smokeless tobacco. He reports current alcohol use. He reports that he does not use drugs.  ROS: All other review of systems were reviewed and are negative except what is noted above in HPI  Physical Exam: BP (!) 154/95   Pulse 90   Constitutional:  Alert and oriented, No acute distress. HEENT: [place] AT, moist mucus membranes.  Trachea midline, no masses. Cardiovascular: No clubbing, cyanosis, or edema. Respiratory: Normal respiratory effort, no increased work of breathing. GI: Abdomen is soft, nontender, nondistended, no abdominal masses GU: No CVA tenderness. Circumcised phallus. No masses/lesions on penis, testis, scrotum. Bilateral vas deferens palpable.   Lymph: No cervical or inguinal lymphadenopathy. Skin: No rashes, bruises or suspicious lesions. Neurologic: Grossly intact, no focal deficits,  moving all 4 extremities. Psychiatric: Normal mood and affect.  Laboratory Data: No results found for: "WBC", "HGB", "HCT", "MCV", "PLT"  No results found for: "CREATININE"  No results found for: "PSA"  No results found for: "TESTOSTERONE"  No results found for: "HGBA1C"  Urinalysis No results found for: "COLORURINE", "APPEARANCEUR", "LABSPEC", "PHURINE", "GLUCOSEU", "HGBUR", "BILIRUBINUR", "KETONESUR", "PROTEINUR", "UROBILINOGEN", "NITRITE", "LEUKOCYTESUR"  No results found for: "LABMICR", "WBCUA", "RBCUA", "LABEPIT", "MUCUS", "BACTERIA"  Pertinent Imaging:  No results found for this or any previous visit.  No results found for this or any previous visit.  No results found for this or any previous visit.  No results found for this or any previous visit.  No results found for this or any previous visit.  No valid procedures specified. No results found for this or any previous visit.  No results found for this or any previous visit.   Assessment & Plan:    1. Screening and evaluation for vasectomy Schedule for vasectomy -rx for valium sent to pharmacy   No follow-ups on file.  Nicolette Bang, MD  [facility] [place]

## 2022-02-08 ENCOUNTER — Ambulatory Visit (INDEPENDENT_AMBULATORY_CARE_PROVIDER_SITE_OTHER): Payer: BC Managed Care – PPO | Admitting: Urology

## 2022-02-08 ENCOUNTER — Encounter: Payer: Self-pay | Admitting: Urology

## 2022-02-08 VITALS — BP 130/97 | HR 90

## 2022-02-08 DIAGNOSIS — Z302 Encounter for sterilization: Secondary | ICD-10-CM | POA: Diagnosis not present

## 2022-02-08 DIAGNOSIS — Z9852 Vasectomy status: Secondary | ICD-10-CM

## 2022-02-08 NOTE — Progress Notes (Signed)
02/08/22  CC: desires sterilization   HPI: Mr Lowdermilk is a 37yo here for vasectomy Blood pressure (!) 130/97, pulse 90. NED. A&Ox3.   No respiratory distress   Abd soft, NT, ND Normal external genitalia with patent urethral meatus  A timeout was performed.  Patient's identity and consent was confirmed.  All questions were answered.   Bilateral Vasectomy Procedure  Pre-Procedure: - Patient's scrotum was prepped and draped for vasectomy. - The vas was palpated through the scrotal skin on the left. - 1% Xylocaine was injected into the skin and surrounding tissue for placement  - In a similar manner, the vas on the right was identified, anesthetized, and stabilized.  Procedure: - A sharp hemostat was used to make a small stab incision in the skin overlying the vas - The left vas was isolated and brought up through the incision exposing that structure. - Bleeding points were cauterized as they occurred. - The vas was free from the surrounding structures and brought to the view. - A segment was positioned for placement with a hemostat. - A second hemostat was placed and a small segment between the two hemostats and was removed for inspection. - Each end of the transected vas lumen was fulgurated/ obliterated using needlepoint electrocautery -A fascial interposition was performed on testicular end of the vas using #3-0 chromic suture -The same procedure was performed on the right. - A single suture of #3-0 chromic catgut was used to close each lateral scrotal skin incision - A dressing was applied.  Post-Procedure: - Patient was instructed in care of the operative area - A specimen is to be delivered in 12 weeks   -Another form of contraception is to be used until post vasectomy semen analysis  Nicolette Bang, MD

## 2022-02-08 NOTE — Patient Instructions (Signed)
Vasectomy Postoperative Instructions ? ?Please bring back a semen analysis in approximately 3 months.  ?Your semen analysis  will need to be taken to  ?[facility]  [address], [place], [place] [address] ?[phone] ? ? You will be given a sterile specimen cup. Please label the cup with your name, date of birth, date and time of collections.  ?What to Expect ? - slight redness, swelling and scant drainage along the incision ? - mild to moderate discomfort ? - black and blue (bruising) as the tissue heals ? - low grade fever ? - scrotal sensitivity and/or tenderness ?- Edges of the incision may pull apart and heal slowly, sometimes a knot may be present which remains for several months.  This is NORMAL and all part of the healing process. ?- if stitches are placed, they do not need to be removed ?- if you have pain or discomfort immediately after the vasectomy, you may use OTC pain medication for relief , ex: tylenol.  After local anesthetic wears off an ice pack will provide additional comfort and can also prevent swelling if used ? ?Activity ? - no sexual intercourse for at lease 5 days depending on comfort ? - no heavy lifting for 48-72 hours (anything over 5-10 lbs) ? ?Wound Care ? - shower only after 24 hours ? - no tub baths, hot tub, or pools for at least 7 days ? - ice packs for 48 hours: 30 minutes on and 30 minutes off ? ?Problem to Report ? - generalized redness ? - increased pain and swelling ? - fever greater than 101 F ? - significant drainage or bleeding from the wound ? ?TO DO ?- Ejaculations help to clear the passage of sperm, but you must use another from of birth control until you are told you may discontinue its use!! ?- You will be given a specimen cup to bring back a semen sample in 3 months to check and see if its clear of sperm.  Only after the semen is sent for analysis and is reported back as clear should you use this as your primary form of birth control!    ?

## 2022-04-27 LAB — POST-VAS SPERM EVALUATION,QUAL: Volume: 2.5 mL

## 2022-05-07 NOTE — Progress Notes (Signed)
Letter sent.

## 2023-12-10 ENCOUNTER — Telehealth: Payer: Self-pay

## 2023-12-10 NOTE — Telephone Encounter (Signed)
 Pt called in about having annual test done after vasectomy. Pt is made aware we do not annual visited for vasectomy.. Pt was ask did he do his post vasectomy semen analysis 3 month after his procedure. Pt state's yes and was only calling because he heard word of mouth from others that he may need an annual.
